# Patient Record
Sex: Female | Born: 1970 | Race: White | Hispanic: No | Smoking: Current every day smoker
Health system: Southern US, Community
[De-identification: ages and names within clinical notes are randomized; demographics above are authoritative.]

## PROBLEM LIST (undated history)

## (undated) DIAGNOSIS — I10 Essential (primary) hypertension: Secondary | ICD-10-CM

## (undated) DIAGNOSIS — F419 Anxiety disorder, unspecified: Secondary | ICD-10-CM

## (undated) HISTORY — DX: Essential (primary) hypertension: I10

## (undated) HISTORY — DX: Anxiety disorder, unspecified: F41.9

## (undated) HISTORY — PX: TUBAL LIGATION: SHX77

---

## 2000-10-23 ENCOUNTER — Inpatient Hospital Stay (HOSPITAL_COMMUNITY): Admission: AD | Admit: 2000-10-23 | Discharge: 2000-10-23 | Payer: Self-pay | Admitting: Obstetrics

## 2000-12-01 ENCOUNTER — Inpatient Hospital Stay (HOSPITAL_COMMUNITY): Admission: AD | Admit: 2000-12-01 | Discharge: 2000-12-01 | Payer: Self-pay | Admitting: Obstetrics

## 2000-12-13 ENCOUNTER — Inpatient Hospital Stay (HOSPITAL_COMMUNITY): Admission: AD | Admit: 2000-12-13 | Discharge: 2000-12-13 | Payer: Self-pay | Admitting: Obstetrics

## 2000-12-18 ENCOUNTER — Inpatient Hospital Stay (HOSPITAL_COMMUNITY): Admission: AD | Admit: 2000-12-18 | Discharge: 2000-12-21 | Payer: Self-pay | Admitting: Obstetrics

## 2001-08-11 ENCOUNTER — Encounter: Payer: Self-pay | Admitting: Emergency Medicine

## 2001-08-11 ENCOUNTER — Emergency Department (HOSPITAL_COMMUNITY): Admission: EM | Admit: 2001-08-11 | Discharge: 2001-08-11 | Payer: Self-pay | Admitting: Emergency Medicine

## 2001-11-16 ENCOUNTER — Inpatient Hospital Stay (HOSPITAL_COMMUNITY): Admission: AD | Admit: 2001-11-16 | Discharge: 2001-11-16 | Payer: Self-pay | Admitting: Obstetrics and Gynecology

## 2002-04-17 ENCOUNTER — Inpatient Hospital Stay (HOSPITAL_COMMUNITY): Admission: AD | Admit: 2002-04-17 | Discharge: 2002-04-17 | Payer: Self-pay | Admitting: Obstetrics

## 2002-04-18 ENCOUNTER — Inpatient Hospital Stay (HOSPITAL_COMMUNITY): Admission: AD | Admit: 2002-04-18 | Discharge: 2002-04-20 | Payer: Self-pay | Admitting: Obstetrics

## 2002-04-19 ENCOUNTER — Encounter (INDEPENDENT_AMBULATORY_CARE_PROVIDER_SITE_OTHER): Payer: Self-pay | Admitting: *Deleted

## 2002-04-28 ENCOUNTER — Inpatient Hospital Stay (HOSPITAL_COMMUNITY): Admission: AD | Admit: 2002-04-28 | Discharge: 2002-05-01 | Payer: Self-pay | Admitting: Obstetrics

## 2003-08-19 ENCOUNTER — Emergency Department (HOSPITAL_COMMUNITY): Admission: EM | Admit: 2003-08-19 | Discharge: 2003-08-19 | Payer: Self-pay | Admitting: Emergency Medicine

## 2004-10-10 ENCOUNTER — Emergency Department (HOSPITAL_COMMUNITY): Admission: EM | Admit: 2004-10-10 | Discharge: 2004-10-10 | Payer: Self-pay | Admitting: Emergency Medicine

## 2005-04-14 ENCOUNTER — Emergency Department (HOSPITAL_COMMUNITY): Admission: EM | Admit: 2005-04-14 | Discharge: 2005-04-14 | Payer: Self-pay | Admitting: Emergency Medicine

## 2008-07-04 ENCOUNTER — Emergency Department (HOSPITAL_COMMUNITY): Admission: EM | Admit: 2008-07-04 | Discharge: 2008-07-04 | Payer: Self-pay | Admitting: Emergency Medicine

## 2010-01-11 ENCOUNTER — Emergency Department (HOSPITAL_COMMUNITY): Admission: EM | Admit: 2010-01-11 | Discharge: 2010-01-11 | Payer: Self-pay | Admitting: Emergency Medicine

## 2010-07-01 LAB — URINE MICROSCOPIC-ADD ON

## 2010-07-01 LAB — URINE CULTURE: Culture  Setup Time: 201109270208

## 2010-07-01 LAB — URINALYSIS, ROUTINE W REFLEX MICROSCOPIC
Glucose, UA: NEGATIVE mg/dL
Nitrite: POSITIVE — AB
Protein, ur: >300 mg/dL — AB
Specific Gravity, Urine: >1.03 — ABNORMAL HIGH (ref 1.005–1.030)
Urobilinogen, UA: 1 mg/dL (ref 0.0–1.0)
pH: 6.5 (ref 5.0–8.0)

## 2010-09-03 NOTE — Op Note (Signed)
   NAME:  TERRIN, Katherine Bates                       ACCOUNT NO.:  [id number]   MEDICAL RECORD NO.:  [id number]                   PATIENT TYPE:  INP   LOCATION:  9143                                 FACILITY:  [facility]   PHYSICIAN:  Kathreen Cosier, M.D.           DATE OF BIRTH:  [birth year]   DATE OF PROCEDURE:  DATE OF DISCHARGE:                                 OPERATIVE REPORT   PREOPERATIVE DIAGNOSES:  Multiparity.   PROCEDURE:  Postpartum tubal ligation.  Using epidural patient in supine  position, abdomen prepped and draped.  Bladder emptied with a straight  catheter.  Midline subumbilical incision 1 inch long was made, carried down  to the fascia.  Fascia grasped with two Kochers and fascia and peritoneum  opened with Mayo scissors.  The left tube was grasped in the mid portion  with Babcock clamp and entered, traced to the fimbria.  0 plain suture  placed in the mesosalpinx below the portion of tube within the clamp.  This  was tied and approximately 1 inch of tube transected.  Procedure done in the  exact fashion the other side.  Lap and sponge count was correct.  Abdomen  closed in layers.  Peritoneum and fascia continuous suture of 0 Dexon.  Skin  closed with subcuticular stitch of 0 plain.  The patient tolerated procedure  well.  Taken to recovery room in good condition.                                               Kathreen Cosier, M.D.    BAM/MEDQ  D:  04/19/2002  T:  04/19/2002  Job:  [id number]

## 2010-09-03 NOTE — Discharge Summary (Signed)
   NAME:  DELA, SWEENY NO.:  [id number]   MEDICAL RECORD NO.:  [id number]                   PATIENT TYPE:  INP   LOCATION:  9313                                 FACILITY:  [facility]   PHYSICIAN:  Kathreen Cosier, M.D.           DATE OF BIRTH:  12-Oct-1970   DATE OF ADMISSION:  04/28/2002  DATE OF DISCHARGE:  05/01/2002                                 DISCHARGE SUMMARY   HOSPITAL COURSE:  The patient is a 40 year old gravida 6, para 5 with back  pain, fever and a positive urine.  Her temperature was 102 on admission with  a diagnosis of pyelonephritis.  She was treated with Rocephin 2 g IV every  24 hours.  She rapidly defervesced and by May 01, 2002, she felt much  better and she was afebrile within 24 hours.   LABORATORY DATA:  Her white count was 9.9, hemoglobin 12.9.  Sodium 142,  potassium 3.5, chloride 107, CO2 24.   DISPOSITION:  She was discharged home on May 01, 2002, on ampicillin 500  mg p.o. q.6h. for five days.   DISCHARGE DIAGNOSIS:  Status post pyelonephritis.                                               Kathreen Cosier, M.D.    BAM/MEDQ  D:  06/10/2002  T:  06/10/2002  Job:  [id number]

## 2010-09-03 NOTE — Discharge Summary (Signed)
   NAME:  Katherine Bates, Katherine Bates                       ACCOUNT NO.:  [id number]   MEDICAL RECORD NO.:  [id number]                   PATIENT TYPE:  INP   LOCATION:  9143                                 FACILITY:  [facility]   PHYSICIAN:  Kathreen Cosier, M.D.           DATE OF BIRTH:  Aug 25, 1970   DATE OF ADMISSION:  04/18/2002  DATE OF DISCHARGE:  04/20/2002                                 DISCHARGE SUMMARY   HOSPITAL COURSE:  The patient is a 40 year old gravida 6 para 4-0-1-4, EDC  April 21, 2002.  She was admitted in labor.  She had a negative GBS.  She  was 4 cm, 70%, vertex, -1.  Membranes were artificially ruptured and the  fluid was clear.  On admission her white count was 10.4, hemoglobin 10,  platelets 169.  Urinalysis was negative.  RPR was negative.  She had a  normal vaginal delivery, Apgar 9 and 9, female, and she desired  sterilization.  On April 19, 2002 she underwent postpartum tubal ligation  and did well.  She was discharged home on postpartum day #2, ambulatory, on  a regular diet.   MEDICATIONS:  1. Tylox one to two q.3-4h for pain.  2. Pyridium 200 mg p.o. t.i.d. p.r.n.   DISCHARGE DIAGNOSES:  1. Status post normal vaginal delivery at term.  2. Postpartum tubal ligation.                                               Kathreen Cosier, M.D.    BAM/MEDQ  D:  04/20/2002  T:  04/20/2002  Job:  [id number]

## 2012-02-28 ENCOUNTER — Emergency Department (HOSPITAL_COMMUNITY): Payer: MEDICAID

## 2012-02-28 ENCOUNTER — Encounter (HOSPITAL_COMMUNITY): Payer: Self-pay | Admitting: Emergency Medicine

## 2012-02-28 ENCOUNTER — Emergency Department (HOSPITAL_COMMUNITY)
Admission: EM | Admit: 2012-02-28 | Discharge: 2012-02-29 | Disposition: A | Discharge status: Home / Self Care | Payer: MEDICAID | Attending: Emergency Medicine | Admitting: Emergency Medicine

## 2012-02-28 DIAGNOSIS — F410 Panic disorder [episodic paroxysmal anxiety] without agoraphobia: Secondary | ICD-10-CM | POA: Insufficient documentation

## 2012-02-28 DIAGNOSIS — F1410 Cocaine abuse, uncomplicated: Secondary | ICD-10-CM | POA: Insufficient documentation

## 2012-02-28 DIAGNOSIS — F17200 Nicotine dependence, unspecified, uncomplicated: Secondary | ICD-10-CM | POA: Insufficient documentation

## 2012-02-28 LAB — POCT I-STAT TROPONIN I: Troponin i, poc: 0 ng/mL (ref 0.00–0.08)

## 2012-02-28 NOTE — ED Notes (Signed)
Pt c/o shortness of breath and anxiety/depression.  States she is going through a "bad break-up".  States she has been having panic attacks everyday for the past few weeks.  Denies any alcohol use tonight but states she used cocaine earlier.

## 2012-02-28 NOTE — ED Provider Notes (Signed)
History     CSN: [id number]  Arrival date & time 02/28/12  2241   First MD Initiated Contact with Patient 02/28/12 2303      Chief Complaint  Patient presents with  . Panic Attack    (Consider location/radiation/quality/duration/timing/severity/associated sxs/prior treatment) HPI Comments: Patient is a 41 year old female who presents to the emergency department with complaint of" panic attack". The patient states that she has been very" depressed" because of a bad breakup. Approximately a week ago she tried some alcohol, and earlier this evening she tried some cocaine. She is unsure of exactly how much was as it was a very small amount. The patient states she has never used cocaine before and she is unfamiliar with how it makes her feel. The patient states surely after the cocaine she began to feel" funny in her chest, began to hyperventilate, and then panic. She attempted to relax in the shower but states this did not help and she then presented to the emergency department for evaluation and assistance with this problem.she denies suicidal or homicidal ideation.  The history is provided by the patient.    History reviewed. No pertinent past medical history.  History reviewed. No pertinent past surgical history.  No family history on file.  History  Substance Use Topics  . Smoking status: Current Every Day Smoker    Types: Cigarettes  . Smokeless tobacco: Not on file  . Alcohol Use: Yes    OB History    Grav Para Term Preterm Abortions TAB SAB Ect Mult Living                  Review of Systems  Constitutional: Negative for activity change.       All ROS Neg except as noted in HPI  HENT: Negative for nosebleeds and neck pain.   Eyes: Negative for photophobia and discharge.  Respiratory: Positive for shortness of breath. Negative for cough and wheezing.   Cardiovascular: Positive for palpitations. Negative for chest pain.  Gastrointestinal: Negative for abdominal pain  and blood in stool.  Genitourinary: Negative for dysuria, frequency and hematuria.  Musculoskeletal: Negative for back pain and arthralgias.  Skin: Negative.   Neurological: Negative for dizziness, seizures and speech difficulty.  Psychiatric/Behavioral: Negative for suicidal ideas, hallucinations and confusion. The patient is nervous/anxious.     Allergies  Review of patient's allergies indicates no known allergies.  Home Medications  No current outpatient prescriptions on file.  BP 119/75  Pulse 125  Temp 97.5 F (36.4 C) (Oral)  Resp 18  Ht 5\' 5"  (1.651 m)  Wt 179 lb (81.194 kg)  BMI 29.79 kg/m2  SpO2 100%  LMP 02/26/2012  Physical Exam  Nursing note and vitals reviewed. Constitutional: She is oriented to person, place, and time. She appears well-developed and well-nourished.  Non-toxic appearance.  HENT:  Head: Normocephalic.  Right Ear: Tympanic membrane and external ear normal.  Left Ear: Tympanic membrane and external ear normal.  Eyes: EOM and lids are normal. Pupils are equal, round, and reactive to light.  Neck: Normal range of motion. Neck supple. Carotid bruit is not present.  Cardiovascular: Regular rhythm, normal heart sounds, intact distal pulses and normal pulses.  Tachycardia present.   Pulmonary/Chest: Breath sounds normal. No respiratory distress.  Abdominal: Soft. Bowel sounds are normal. There is no tenderness. There is no guarding.  Musculoskeletal: Normal range of motion.  Lymphadenopathy:       Head (right side): No submandibular adenopathy present.  Head (left side): No submandibular adenopathy present.    She has no cervical adenopathy.  Neurological: She is alert and oriented to person, place, and time. She has normal strength. No cranial nerve deficit or sensory deficit.  Skin: Skin is warm and dry.  Psychiatric: Her speech is normal. Her mood appears anxious. She expresses no homicidal and no suicidal ideation. She expresses no suicidal  plans and no homicidal plans.       Pt anxious and panicking after using cocaine tonight. She used the cocaine because of problems with depression relating to a "break up".    ED Course  Procedures (including critical care time)  Labs Reviewed - No data to display No results found. ZOX:WRUEA tachycardia.Rate 112. Axis normal. PR wnl. QRS wnl. ST wnl. No STEMI. No live threatening arrhythmia. No old EKG for comparison.  No diagnosis found. Pulse Ox 100% on Room Air. WNL by my interpretation.   MDM  I have reviewed nursing notes, vital signs, and all appropriate lab and imaging results for this patient. Chest xray is negative for acute problem. EKG reveals sinus tachycardia, but no acute changes. Troponin in negative. Test results given to the patient. Tachycardia imiproving but not resolved. Ativan po given for "nervousness". Will observe for now. Pt talking on the phone without problem. Pt states she will not try this again because she has children she is concerned about. She request to speak to a counselor as an out patient. [facility]  Phone and address will be given to the patient. 0107 - Pt resting in bed. States she feels much better. Heart rate 90. Safe for pt to be d/c home.      Kathie Dike, [place] 02/29/12 [phone]

## 2012-02-29 MED ORDER — PROMETHAZINE HCL 25 MG/ML IJ SOLN: 12.5000 mg | Freq: Once | INTRAMUSCULAR | Status: DC

## 2012-02-29 MED ORDER — LORAZEPAM 0.5 MG PO TABS: ORAL_TABLET | ORAL | Status: DC

## 2012-02-29 MED ORDER — LORAZEPAM 1 MG PO TABS: 0.5000 mg | ORAL_TABLET | Freq: Three times a day (TID) | ORAL | Status: DC | PRN

## 2012-02-29 MED ORDER — HYDROMORPHONE HCL PF 1 MG/ML IJ SOLN: 0.5000 mg | Freq: Once | INTRAMUSCULAR | Status: DC

## 2012-02-29 MED ORDER — LORAZEPAM 1 MG PO TABS
2.0000 mg | ORAL_TABLET | Freq: Once | ORAL | Status: AC
  Administered 2012-02-29: 2 mg via ORAL
  Filled 2012-02-29: qty 2

## 2012-02-29 NOTE — ED Provider Notes (Signed)
Medical screening examination/treatment/procedure(s) were performed by non-physician practitioner and as supervising physician I was immediately available for consultation/collaboration.  Nicoletta Dress. Colon Branch, MD 02/29/12 [phone]

## 2012-03-24 ENCOUNTER — Emergency Department (HOSPITAL_COMMUNITY)
Admission: EM | Admit: 2012-03-24 | Discharge: 2012-03-24 | Disposition: A | Discharge status: Home / Self Care | Payer: Medicaid Other | Attending: Emergency Medicine | Admitting: Emergency Medicine

## 2012-03-24 ENCOUNTER — Emergency Department (HOSPITAL_COMMUNITY): Payer: Medicaid Other

## 2012-03-24 ENCOUNTER — Encounter (HOSPITAL_COMMUNITY): Payer: Self-pay | Admitting: Emergency Medicine

## 2012-03-24 DIAGNOSIS — M25469 Effusion, unspecified knee: Secondary | ICD-10-CM | POA: Insufficient documentation

## 2012-03-24 DIAGNOSIS — M25461 Effusion, right knee: Secondary | ICD-10-CM

## 2012-03-24 DIAGNOSIS — Y92410 Unspecified street and highway as the place of occurrence of the external cause: Secondary | ICD-10-CM | POA: Insufficient documentation

## 2012-03-24 DIAGNOSIS — Y9389 Activity, other specified: Secondary | ICD-10-CM | POA: Insufficient documentation

## 2012-03-24 DIAGNOSIS — Z79899 Other long term (current) drug therapy: Secondary | ICD-10-CM | POA: Insufficient documentation

## 2012-03-24 DIAGNOSIS — F17200 Nicotine dependence, unspecified, uncomplicated: Secondary | ICD-10-CM | POA: Insufficient documentation

## 2012-03-24 MED ORDER — IBUPROFEN 800 MG PO TABS
800.0000 mg | ORAL_TABLET | Freq: Once | ORAL | Status: AC
  Administered 2012-03-24: 800 mg via ORAL
  Filled 2012-03-24: qty 1

## 2012-03-24 MED ORDER — HYDROCODONE-ACETAMINOPHEN 5-325 MG PO TABS
1.0000 | ORAL_TABLET | Freq: Once | ORAL | Status: AC
  Administered 2012-03-24: 1 via ORAL
  Filled 2012-03-24: qty 1

## 2012-03-24 MED ORDER — HYDROCODONE-ACETAMINOPHEN 5-325 MG PO TABS: 1.0000 | ORAL_TABLET | Freq: Four times a day (QID) | ORAL | Status: AC | PRN
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
## 2012-03-24 NOTE — ED Notes (Signed)
Pt was provided with [employer] Information for pt to call if she wishes to, states that she has a safe place to return at d/c

## 2012-03-24 NOTE — ED Provider Notes (Signed)
Medical screening examination/treatment/procedure(s) were performed by non-physician practitioner and as supervising physician I was immediately available for consultation/collaboration.   Shelby Anderle B. Bernette Mayers, MD 03/24/12 1234
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
## 2012-03-24 NOTE — ED Notes (Signed)
R. Miller, PA at bedside  

## 2012-04-04 ENCOUNTER — Other Ambulatory Visit (HOSPITAL_COMMUNITY): Payer: Self-pay | Admitting: Orthopaedic Surgery

## 2012-04-04 DIAGNOSIS — M25561 Pain in right knee: Secondary | ICD-10-CM

## 2012-04-04 DIAGNOSIS — T1490XA Injury, unspecified, initial encounter: Secondary | ICD-10-CM

## 2012-04-06 ENCOUNTER — Ambulatory Visit (HOSPITAL_COMMUNITY): Admission: RE | Admit: 2012-04-06 | Payer: Medicaid Other | Source: Ambulatory Visit

## 2014-08-15 ENCOUNTER — Telehealth: Payer: Self-pay | Admitting: *Deleted

## 2014-08-15 NOTE — Telephone Encounter (Signed)
Pt states that she shaves "down there". Pt states that there is a place on the left part of her lip, at the bottom. Pt states that she has had the place for about a month. Pt states that it's like a little ball on the inside of her lip. Pt denies any redness or soreness. Pt was advised that she would need to be seen in order for us to tell her exactly what it is. Pt verbalized understanding and phone call was switched up front for an appointment for next week.

## 2014-08-19 ENCOUNTER — Ambulatory Visit: Payer: Self-pay | Admitting: Women's Health

## 2014-08-21 ENCOUNTER — Ambulatory Visit (INDEPENDENT_AMBULATORY_CARE_PROVIDER_SITE_OTHER): Payer: Medicaid Other | Admitting: Advanced Practice Midwife

## 2014-08-21 ENCOUNTER — Encounter: Payer: Self-pay | Admitting: Advanced Practice Midwife

## 2014-08-21 VITALS — BP 160/102 | HR 80 | Ht 65.0 in | Wt 183.0 lb

## 2014-08-21 DIAGNOSIS — N758 Other diseases of Bartholin's gland: Secondary | ICD-10-CM

## 2014-08-21 DIAGNOSIS — Z1389 Encounter for screening for other disorder: Secondary | ICD-10-CM | POA: Diagnosis not present

## 2014-08-21 DIAGNOSIS — Z113 Encounter for screening for infections with a predominantly sexual mode of transmission: Secondary | ICD-10-CM | POA: Diagnosis not present

## 2014-08-21 DIAGNOSIS — R319 Hematuria, unspecified: Secondary | ICD-10-CM

## 2014-08-21 LAB — POCT URINALYSIS DIPSTICK
GLUCOSE UA: NEGATIVE
KETONES UA: NEGATIVE
Leukocytes, UA: NEGATIVE
NITRITE UA: NEGATIVE

## 2014-08-21 NOTE — Patient Instructions (Signed)
Call [phone] to schedule mammogram.

## 2014-08-21 NOTE — Progress Notes (Signed)
[facility] Visit  Patient name: Katherine Bates MRN [id number]  Date of birth: Jan 26, 1971  CC & HPI:  Katherine Bates is a 44 y.o. Caucasian female presenting today for C/O "boil" on left labia.  She states she was shaving about a month ago and niticed it.  It does not hurt, it has not gotten any bigger since she noticed it. She has not done anything to help it.  Sometimes it bothers her "because it rubs on my underware" Last pa 5 years ago.  Never has had a mammogram  Pertinent History Reviewed:  Medical & Surgical Hx:   Past Medical History  Diagnosis Date  . Hypertension   . Anxiety    Past Surgical History  Procedure Laterality Date  . Tubal ligation     Family History  Problem Relation Age of Onset  . Hyperlipidemia Mother   . Osteoporosis Mother   . Cancer Mother   . Diabetes Father   . Heart disease Father   . Alcohol abuse Brother   . Hypertension Maternal Grandmother   . Hypertension Maternal Grandfather     Current outpatient prescriptions:  .  clonazePAM (KLONOPIN) 0.5 MG tablet, Take 0.5 mg by mouth every 8 (eight) hours as needed for anxiety., Disp: , Rfl:  .  hydrochlorothiazide (MICROZIDE) 12.5 MG capsule, Take 12.5 mg by mouth daily., Disp: , Rfl:  .  LORazepam (ATIVAN) 1 MG tablet, Take 0.5 tablets (0.5 mg total) by mouth 3 (three) times daily as needed for anxiety. (Patient not taking: Reported on 08/21/2014), Disp: 15 tablet, Rfl: 0 Social History: Reviewed -  reports that she has been smoking Cigarettes.  She has never used smokeless tobacco.  Review of Systems:   Constitutional: Negative for fever and chills Eyes: Negative for visual disturbances Respiratory: Negative for shortness of breath, dyspnea Cardiovascular: Negative for chest pain or palpitations  Gastrointestinal: Negative for vomiting, diarrhea and constipation; no abdominal pain Genitourinary: Negative for dysuria and urgency, vaginal irritation or itching Musculoskeletal: Negative for  back pain, joint pain, myalgias  Neurological: Negative for dizziness and headaches    Objective Findings:  Vitals: BP 160/102 mmHg  Pulse 80  Ht 5\' 5"  (1.651 m)  Wt 183 lb (83.008 kg)  BMI 30.45 kg/m2  LMP 08/17/2014 (Approximate)  Physical Examination: General appearance - alert, well appearing, and in no distress Mental status - alert, oriented to person, place, and time Pelvic - Vulva normal  Vagina:  Looks normal.  When shown by pt where the "boil" is, there is noted to be a soft, mobile 2cm thickened area in the area of the bartholin gland. It is non tender, non erythemous, non inflammed. Discharge is normal without odor. Co-Exam with Cyril MourningJennifer Griffin, NP.     Results for orders placed or performed in visit on 08/21/14 (from the past 24 hour(s))  POCT urinalysis dipstick   Collection Time: 08/21/14  2:10 PM  Result Value Ref Range   Color, UA yellow    Clarity, UA dark    Glucose, UA neg    Bilirubin, UA     Ketones, UA neg    Spec Grav, UA     Blood, UA large    pH, UA     Protein, UA trace    Urobilinogen, UA     Nitrite, UA neg    Leukocytes, UA Negative        Assessment & Plan:  A:   ? Bartholin gland cyst, ? Fatty tissie  P:  Pt to schedule pap and physical for 3 weeks with either me or Victorino DikeJennifer so we can compare it.  Wonder whether anything needs to be done at all.  Mammogram recommended  CRESENZO-DISHMAN,Sreeja Spies CNM 08/21/2014 3:39 PM

## 2014-08-21 NOTE — Addendum Note (Signed)
Addended by: Criss AlvinePULLIAM, Keldan Eplin G on: 08/21/2014 04:49 PM   Modules accepted: Orders

## 2014-08-23 LAB — GC/CHLAMYDIA PROBE AMP
Chlamydia trachomatis, NAA: POSITIVE — AB
NEISSERIA GONORRHOEAE BY PCR: NEGATIVE

## 2014-08-27 ENCOUNTER — Other Ambulatory Visit: Payer: Self-pay | Admitting: Advanced Practice Midwife

## 2014-08-27 DIAGNOSIS — A749 Chlamydial infection, unspecified: Secondary | ICD-10-CM

## 2014-08-27 MED ORDER — AZITHROMYCIN 500 MG PO TABS: 1000.0000 mg | ORAL_TABLET | Freq: Every day | ORAL | Status: DC
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
## 2014-09-11 ENCOUNTER — Other Ambulatory Visit: Payer: Medicaid Other | Admitting: Advanced Practice Midwife

## 2014-09-22 IMAGING — CR DG KNEE COMPLETE 4+V*R*
4 series · 4 of 4 positions shown · non-contrast
Comparison: None.

CLINICAL DATA: Knee pain.

RIGHT KNEE - COMPLETE 4+ VIEW

[view not recorded (1 of 4)]
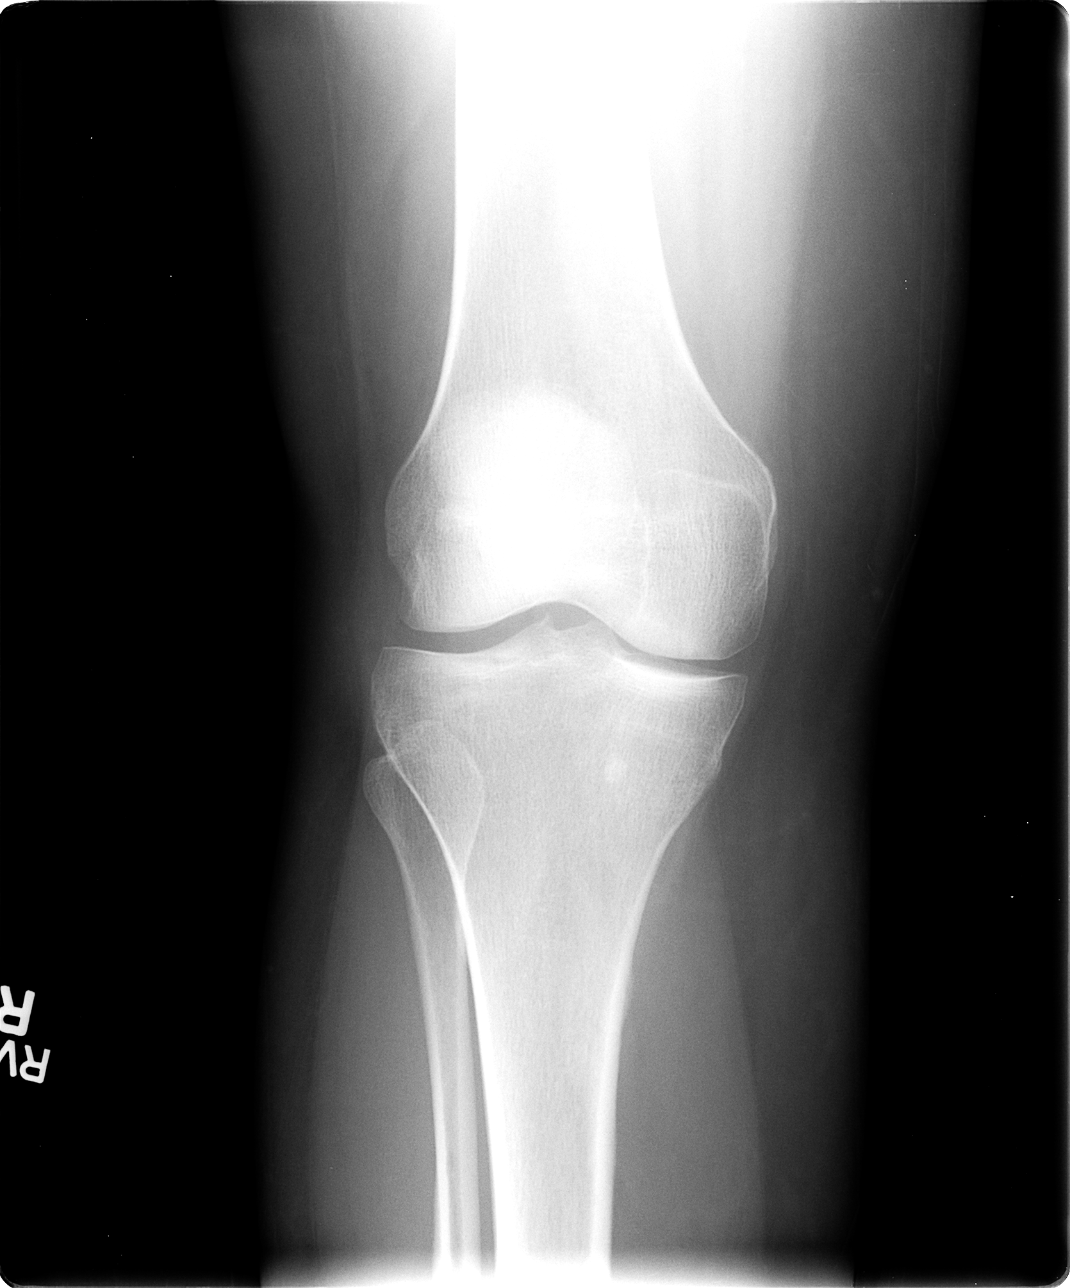

[view not recorded (2 of 4)]
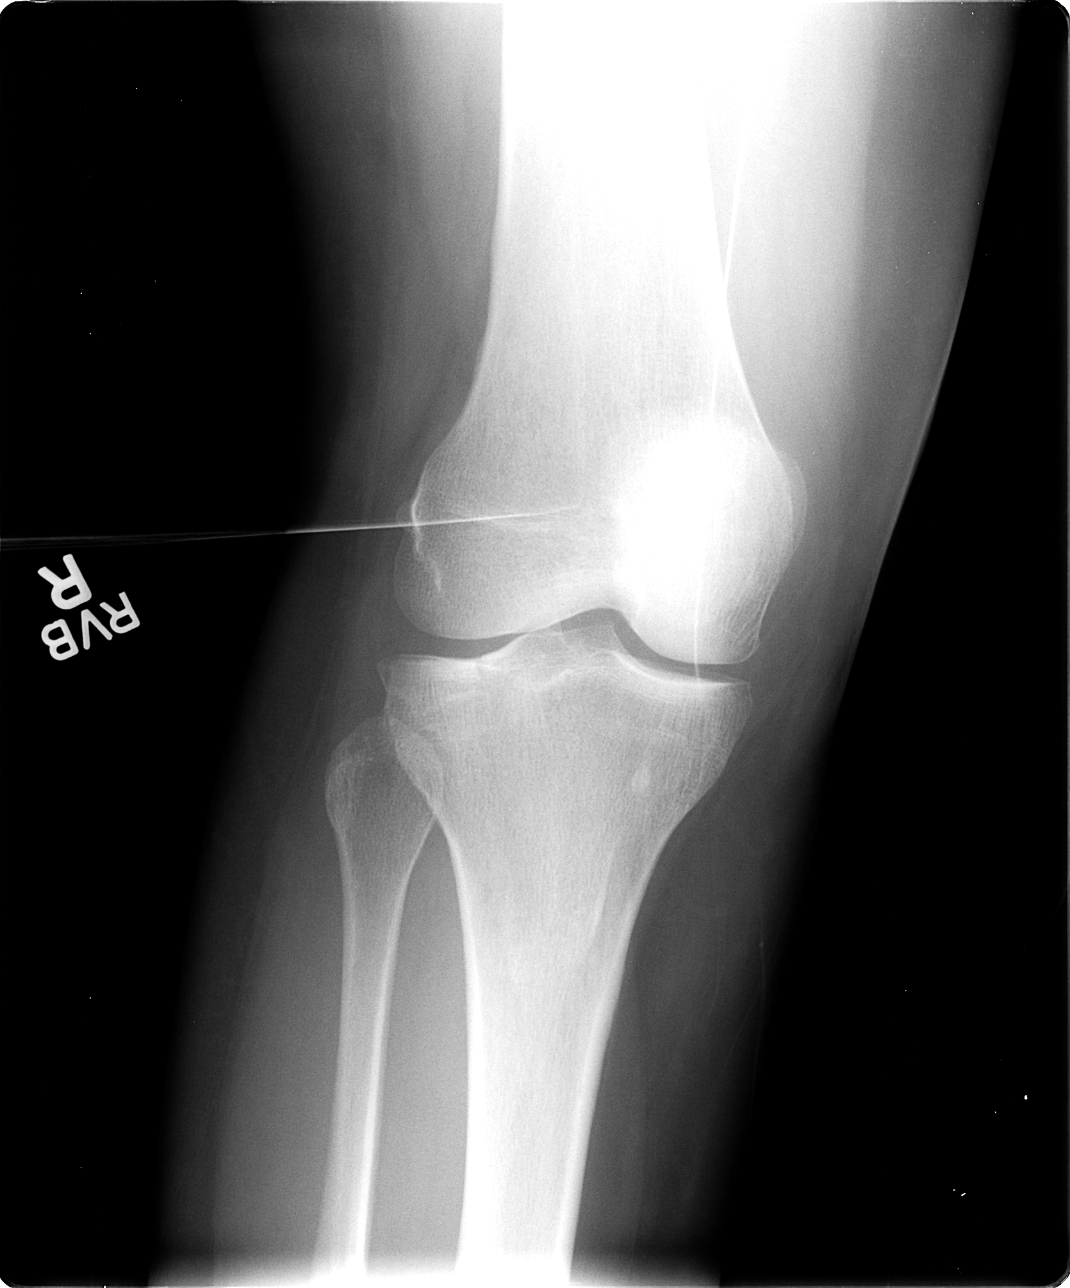

[view not recorded (3 of 4)]
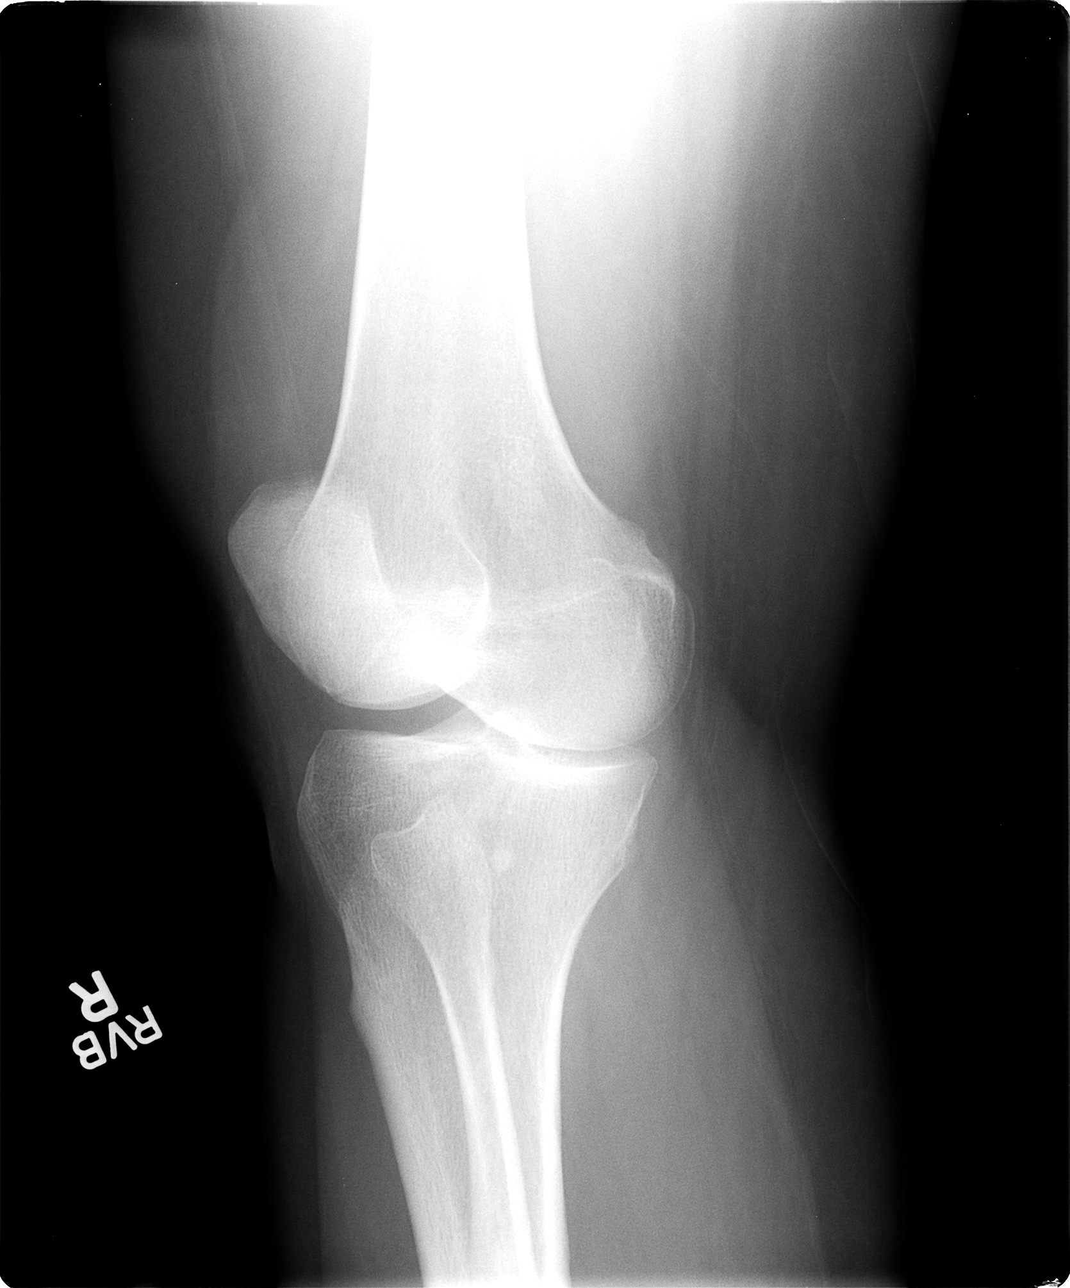

[view not recorded (4 of 4)]
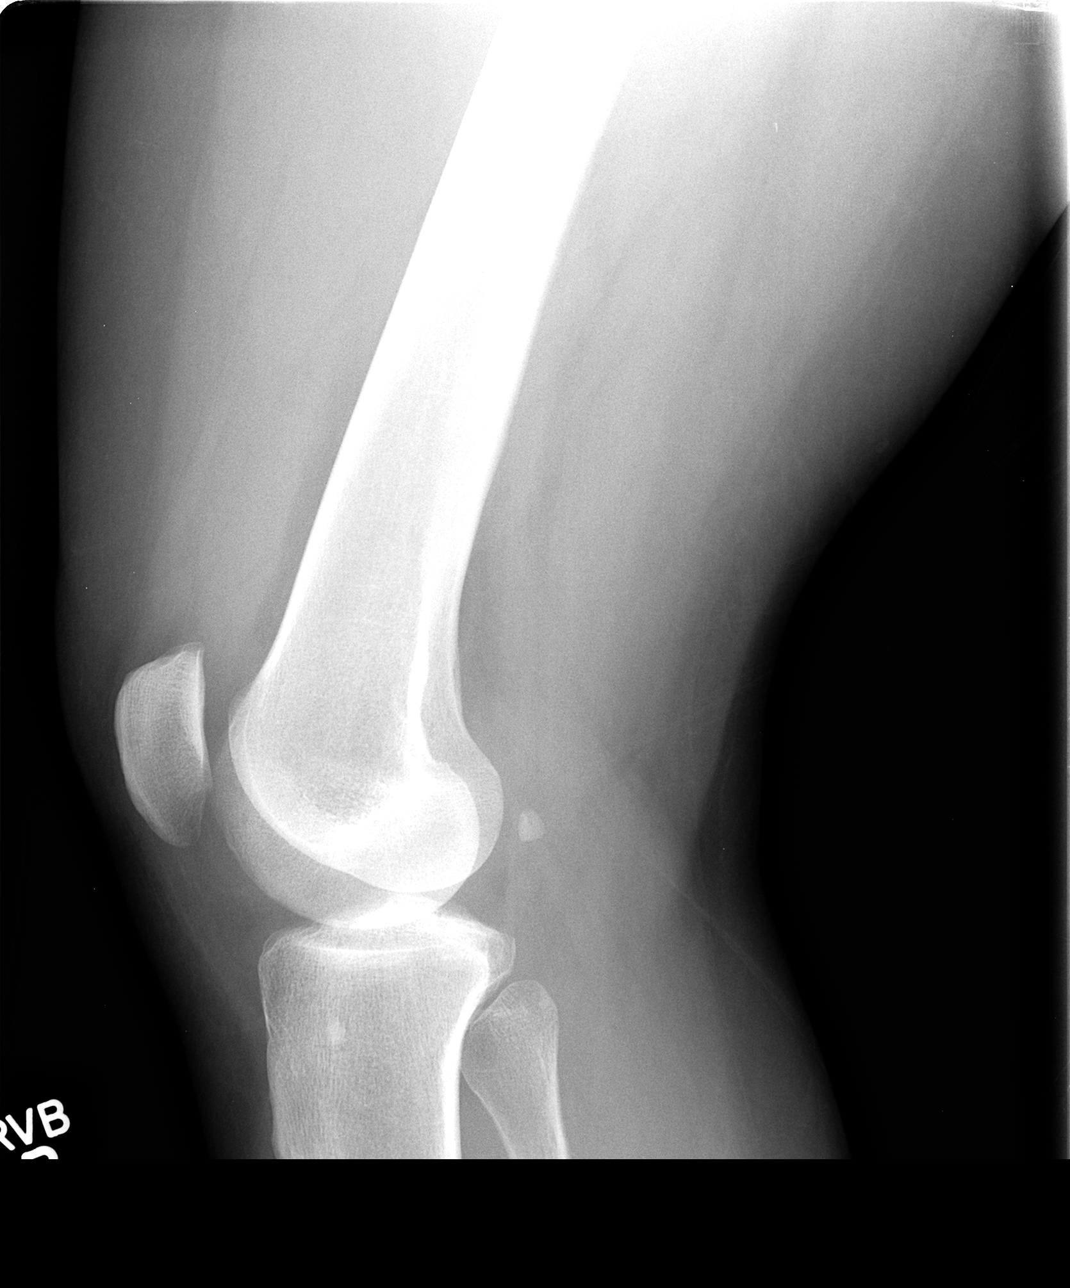

[4 of 4 positions shown; findings below may reference images not displayed]

FINDINGS: Small joint effusion is noted.  There is no fracture or
dislocation.  Joint spaces are preserved.
IMPRESSION: Small joint effusion.  Otherwise negative.

## 2015-09-28 ENCOUNTER — Emergency Department (HOSPITAL_COMMUNITY)
Admission: EM | Admit: 2015-09-28 | Discharge: 2015-09-28 | Disposition: A | Discharge status: Home / Self Care | Payer: Medicaid Other | Attending: Emergency Medicine | Admitting: Emergency Medicine

## 2015-09-28 ENCOUNTER — Encounter (HOSPITAL_COMMUNITY): Payer: Self-pay | Admitting: Emergency Medicine

## 2015-09-28 DIAGNOSIS — K047 Periapical abscess without sinus: Secondary | ICD-10-CM | POA: Insufficient documentation

## 2015-09-28 DIAGNOSIS — F17210 Nicotine dependence, cigarettes, uncomplicated: Secondary | ICD-10-CM | POA: Insufficient documentation

## 2015-09-28 DIAGNOSIS — Z791 Long term (current) use of non-steroidal anti-inflammatories (NSAID): Secondary | ICD-10-CM | POA: Diagnosis not present

## 2015-09-28 DIAGNOSIS — I10 Essential (primary) hypertension: Secondary | ICD-10-CM | POA: Insufficient documentation

## 2015-09-28 DIAGNOSIS — K0889 Other specified disorders of teeth and supporting structures: Secondary | ICD-10-CM | POA: Diagnosis present

## 2015-09-28 MED ORDER — AMOXICILLIN 500 MG PO CAPS: 500.0000 mg | ORAL_CAPSULE | Freq: Three times a day (TID) | ORAL | Status: AC

## 2015-09-28 MED ORDER — IBUPROFEN 600 MG PO TABS: 600.0000 mg | ORAL_TABLET | Freq: Four times a day (QID) | ORAL | Status: AC | PRN

## 2015-09-28 MED ORDER — TRAMADOL HCL 50 MG PO TABS: 50.0000 mg | ORAL_TABLET | Freq: Four times a day (QID) | ORAL | Status: AC | PRN

## 2015-09-28 NOTE — ED Notes (Signed)
At discharge pt states she is allergic to tramadol.  Allergy added to pt's allergy list and rx placed in shred box.

## 2015-09-28 NOTE — ED Notes (Signed)
Pt states she has a painful, swollen tooth on bottom right.  Also c/o sinus congestion.

## 2015-09-28 NOTE — Discharge Instructions (Signed)
Take Zyrtec for congestion daily. Take ibuprofen for pain. Take tramadol for severe pain. Take amoxicillin as prescribed until all gone for both sinus infection and dental infection. Follow-up with a dentist as soon as able.   Dental Abscess A dental abscess is a collection of pus in or around a tooth. CAUSES This condition is caused by a bacterial infection around the root of the tooth that involves the inner part of the tooth (pulp). It may result from:  Severe tooth decay.  Trauma to the tooth that allows bacteria to enter into the pulp, such as a broken or chipped tooth.  Severe gum disease around a tooth. SYMPTOMS Symptoms of this condition include:  Severe pain in and around the infected tooth.  Swelling and redness around the infected tooth, in the mouth, or in the face.  Tenderness.  Pus drainage.  Bad breath.  Bitter taste in the mouth.  Difficulty swallowing.  Difficulty opening the mouth.  Nausea.  Vomiting.  Chills.  Swollen neck glands.  Fever. DIAGNOSIS This condition is diagnosed with examination of the infected tooth. During the exam, your dentist may tap on the infected tooth. Your dentist will also ask about your medical and dental history and may order X-rays. TREATMENT This condition is treated by eliminating the infection. This may be done with:  Antibiotic medicine.  A root canal. This may be performed to save the tooth.  Pulling (extracting) the tooth. This may also involve draining the abscess. This is done if the tooth cannot be saved. HOME CARE INSTRUCTIONS  Take medicines only as directed by your dentist.  If you were prescribed antibiotic medicine, finish all of it even if you start to feel better.  Rinse your mouth (gargle) often with salt water to relieve pain or swelling.  Do not drive or operate heavy machinery while taking pain medicine.  Do not apply heat to the outside of your mouth.  Keep all follow-up visits as  directed by your dentist. This is important. SEEK MEDICAL CARE IF:  Your pain is worse and is not helped by medicine. SEEK IMMEDIATE MEDICAL CARE IF:  You have a fever or chills.  Your symptoms suddenly get worse.  You have a very bad headache.  You have problems breathing or swallowing.  You have trouble opening your mouth.  You have swelling in your neck or around your eye.   This information is not intended to replace advice given to you by your health care provider. Make sure you discuss any questions you have with your health care provider.   Document Released: 04/04/2005 Document Revised: 08/19/2014 Document Reviewed: 04/01/2014 Elsevier Interactive Patient Education [employer]2016 [employer]

## 2015-09-28 NOTE — ED Provider Notes (Signed)
CSN: [id number]     Arrival date & time 09/28/15  1247 History  By signing my name below, I, Katherine Bates, attest that this documentation has been prepared under the direction and in the presence of Riki Gehring A Isaiahs Chancy, PA-C. Electronically Signed: Ronney LionSuzanne Bates, ED Scribe. 09/28/2015. 2:13 PM.    Chief Complaint  Patient presents with  . Dental Pain    The history is provided by the patient. No language interpreter was used.   HPI Comments: Katherine Bates is a 45 y.o. female with a history of hypertension and anxiety, who presents to the Emergency Department complaining of constant, severe, aching lower right dental pain that began when she [place] from the beach 1 [place] ago. She also complains of sinus congestion, bilateral ear fullness, and watery eye discharge. Patient reports a history of sinus infections, but states she is unsure whether her symptoms are connected to her dental pain. No treatments were noted. Patient states exposure to cold air seemed to exacerbate her dental pain.  Past Medical History  Diagnosis Date  . Hypertension   . Anxiety    Past Surgical History  Procedure Laterality Date  . Tubal ligation     Family History  Problem Relation Age of Onset  . Hyperlipidemia Mother   . Osteoporosis Mother   . Cancer Mother   . Diabetes Father   . Heart disease Father   . Alcohol abuse Brother   . Hypertension Maternal Grandmother   . Hypertension Maternal Grandfather    Social History  Substance Use Topics  . Smoking status: Current Every Day Smoker    Types: Cigarettes  . Smokeless tobacco: Never Used  . Alcohol Use: Yes     Comment: social   OB History    No data available     Review of Systems  HENT: Positive for congestion, dental problem and sinus pressure.   Eyes: Positive for discharge.   Allergies  Review of patient's allergies indicates no known allergies.  Home Medications   Prior to Admission medications   Medication Sig Start Date End Date  Taking? Authorizing Provider  azithromycin (ZITHROMAX) 500 MG tablet Take 2 tablets (1,000 mg total) by mouth daily. 08/27/14   Jacklyn ShellFrances Cresenzo-Dishmon, CNM  clonazePAM (KLONOPIN) 0.5 MG tablet Take 0.5 mg by mouth every 8 (eight) hours as needed for anxiety.    Historical Provider, MD  hydrochlorothiazide (MICROZIDE) 12.5 MG capsule Take 12.5 mg by mouth daily.    Historical Provider, MD  LORazepam (ATIVAN) 1 MG tablet Take 0.5 tablets (0.5 mg total) by mouth 3 (three) times daily as needed for anxiety. Patient not taking: Reported on 08/21/2014 02/29/12   Ivery QualeHobson Bryant, PA-C   BP 163/87 mmHg  Pulse 74  Temp(Src) 98.3 F (36.8 C) (Oral)  Resp 18  Ht 5\' 4"  (1.626 m)  Wt 192 lb (87.091 kg)  BMI 32.94 kg/m2  SpO2 97%  LMP 09/14/2015 Physical Exam  Constitutional: She is oriented to person, place, and time. She appears well-developed and well-nourished. No distress.  HENT:  Head: Normocephalic and atraumatic.  TMs are normal bilaterally. External ear and ear canal normal bilaterally. Oropharynx normal. Right lower first molar with surrounding gum swelling and early abscess. Tender to palpation. Tooth with obvious carries  Eyes: Conjunctivae and EOM are normal.  Neck: Neck supple. No tracheal deviation present.  Cardiovascular: Normal rate.   Pulmonary/Chest: Effort normal. No respiratory distress.  Musculoskeletal: Normal range of motion.  Neurological: She is alert and oriented to person,  place, and time.  Skin: Skin is warm and dry.  Psychiatric: She has a normal mood and affect. Her behavior is normal.  Nursing note and vitals reviewed.   ED Course  Procedures (including critical care time)  DIAGNOSTIC STUDIES: Oxygen Saturation is 97% on RA, normal by my interpretation.    COORDINATION OF CARE: 2:12 PM - Discussed treatment plan with pt at bedside which includes Rx antibiotics to cover for both dental infection and possible bacterial sinusitis. Discussed with pt that sinusitis  may be viral, so she may not see improvement after completing antibiotic course. Advised Zyrtec for sinus symptoms as needed. Pt verbalized understanding and agreed to plan.   MDM   Final diagnoses:  Dental abscess   Patient with toothache.  Abscess noted on exam.  Exam unconcerning for Ludwig's angina or spread of infection.    Urged patient to follow-up with dentist.    Patient complaining of symptoms of sinusitis.  Will cover with amoxicillin for both. Ibuprofen and tramadol for pain.  Filed Vitals:   09/28/15 1252  BP: 163/87  Pulse: 74  Temp: 98.3 F (36.8 C)  TempSrc: Oral  Resp: 18  Height:  (1.626 m)  Weight: 87.091 kg  SpO2: 97%   I personally performed the services described in this documentation, which was scribed in my presence. The recorded information has been reviewed and is accurate.    Jaynie Crumble, PA-C 09/28/15 1451  Blane Ohara, MD 09/28/15 1554

## 2019-02-13 ENCOUNTER — Encounter: Payer: Self-pay | Admitting: Orthopedic Surgery

## 2019-02-13 ENCOUNTER — Ambulatory Visit: Payer: Self-pay | Admitting: Orthopedic Surgery

## 2024-04-02 ENCOUNTER — Ambulatory Visit: Admitting: Orthopedic Surgery

## 2024-04-16 ENCOUNTER — Other Ambulatory Visit (INDEPENDENT_AMBULATORY_CARE_PROVIDER_SITE_OTHER): Payer: Worker's Compensation

## 2024-04-16 ENCOUNTER — Encounter: Payer: Self-pay | Admitting: Orthopedic Surgery

## 2024-04-16 ENCOUNTER — Ambulatory Visit: Payer: Worker's Compensation | Admitting: Orthopedic Surgery

## 2024-04-16 DIAGNOSIS — M1711 Unilateral primary osteoarthritis, right knee: Secondary | ICD-10-CM | POA: Diagnosis not present

## 2024-04-16 DIAGNOSIS — M25561 Pain in right knee: Secondary | ICD-10-CM

## 2024-04-16 DIAGNOSIS — W01198A Fall on same level from slipping, tripping and stumbling with subsequent striking against other object, initial encounter: Secondary | ICD-10-CM | POA: Diagnosis not present

## 2024-04-16 DIAGNOSIS — G8929 Other chronic pain: Secondary | ICD-10-CM | POA: Diagnosis not present

## 2024-04-16 NOTE — Progress Notes (Signed)
 New Patient Visit  Summary: Katherine Bates is a 53 y.o. female with the following: 1. Chronic pain of right knee  Assessment and Plan Assessment & Plan Chronic right knee pain Chronic right knee pain post-trauma with anterior knee discomfort and intermittent swelling. Full range of motion, no swelling or instability. Pain responds to physical therapy. No surgical intervention needed. - Continue physical therapy for remaining sessions. - Home exercise program and ambulation as tolerated for quadriceps and lower extremity strengthening. - Use ice for intermittent swelling as needed. - Ibuprofen  800 mg as needed for analgesia. - Follow-up in six weeks post-physical therapy.  Primary osteoarthritis of right knee Mild primary osteoarthritis on radiography, not primary cause of current pain. Improvement expected with therapy and strengthening. - Physical therapy and strengthening as primary interventions for anterior knee pain and mild osteoarthritis. - Improvement expected with continued therapy. - Surgical intervention not indicated.     Follow-up: Return in about 6 weeks (around 05/28/2024).  Subjective:  Chief Complaint  Patient presents with   Knee Injury    R after a fall at work in [employer] 12/09/23 was sent home and had a virtual visit but wasn't sent for x-rays. Pt states she went back to work for almost 3 wks but ended up having a lot of swelling and pain. Pt has been out of work since 02/26/24 and has been going to PT at [facility] for appox 49mo. Has had x-rays previously but wasn't the same day as injury. Does take ibuprofen , heat and ice for pain and it does help.      Discussed the use of AI scribe software for clinical note transcription with the patient, who gave verbal consent to proceed.  History of Present Illness Katherine Bates is a 53 year old female with mild right knee osteoarthritis who presents for evaluation of persistent right knee pain following a  work-related fall.  On December 09, 2023, she fell at work in a walk-in freezer, landing directly on her right knee after tripping over a box. She needed assistance from coworkers to stand. She returned to work after about three weeks but had persistent right knee pain and swelling with lifting, pulling, pushing, and cleaning, and has been out of work since February 26, 2024 due to these symptoms.  She has anterior right knee pain and swelling around the patella that worsens with prolonged activity and improves with rest. She notes popping with walking and difficulty tying her shoes or getting down on the floor. She denies prior right knee injury.  Initial urgent care x-rays of the knee, leg, hip, arm, and lower back showed no fracture. She is in physical therapy and has completed 5 of 12 sessions with mild improvement in flexibility and symptoms. She uses ibuprofen  800 mg as needed every other day to every three days and applies ice for swelling.    Review of Systems: No fevers or chills No numbness or tingling No chest pain No shortness of breath No bowel or bladder dysfunction No GI distress No headaches   Medical History:  Past Medical History:  Diagnosis Date   Anxiety    Hypertension     Past Surgical History:  Procedure Laterality Date   TUBAL LIGATION      Family History  Problem Relation Age of Onset   Hyperlipidemia Mother    Osteoporosis Mother    Cancer Mother    Diabetes Father    Heart disease Father    Alcohol abuse  Brother    Hypertension Maternal Grandmother    Hypertension Maternal Grandfather    Social History[1]  Allergies[2]  Active Medications[3]  Objective: There were no vitals taken for this visit.  Physical Exam:    General: Alert and oriented. and No acute distress. Gait: Right sided antalgic gait.  Physical Exam MUSCULOSKELETAL: Right knee with good range of motion, without swelling, stable, and without ligament tear.  Negative  Lachman.  No increased laxity varus or valgus stress.  Point tenderness directly over the anterior knee, in line with the inferior aspect of the patella.  No tenderness palpation along the medial or lateral joint line.  No bruising is appreciated   IMAGING: I personally ordered and reviewed the following images   X-rays of the right knee were obtained in clinic today.  No acute injuries are noted.  Mild loss of joint space within the medial compartment.  There are small osteophytes within the patellofemoral compartment.  No bony lesions.  Impression: Right knee x-rays without acute injury; mild degenerative changes overall     New Medications:  No orders of the defined types were placed in this encounter.     Portions of this note were completed via Scientist, clinical (histocompatibility and immunogenetics).  Oneil DELENA Horde, MD  04/16/2024 11:47 AM      [1]  Social History Tobacco Use   Smoking status: Every Day    Types: Cigarettes   Smokeless tobacco: Never  Substance Use Topics   Alcohol use: Yes    Comment: social   Drug use: Yes    Types: Cocaine  [2]  Allergies Allergen Reactions   Tramadol  Hives  [3]  Current Meds  Medication Sig   amoxicillin  (AMOXIL ) 500 MG capsule Take 1 capsule (500 mg total) by mouth 3 (three) times daily.   clonazePAM (KLONOPIN) 0.5 MG tablet Take 0.5 mg by mouth every 8 (eight) hours as needed for anxiety.   hydrochlorothiazide (MICROZIDE) 12.5 MG capsule Take 12.5 mg by mouth daily.   ibuprofen  (ADVIL ,MOTRIN ) 200 MG tablet Take 400 mg by mouth every 6 (six) hours as needed.   ibuprofen  (ADVIL ,MOTRIN ) 600 MG tablet Take 1 tablet (600 mg total) by mouth every 6 (six) hours as needed.   traMADol  (ULTRAM ) 50 MG tablet Take 1 tablet (50 mg total) by mouth every 6 (six) hours as needed.

## 2024-04-29 ENCOUNTER — Telehealth: Payer: Self-pay | Admitting: Orthopedic Surgery

## 2024-04-29 ENCOUNTER — Encounter: Payer: Self-pay | Admitting: Orthopedic Surgery

## 2024-04-29 NOTE — Telephone Encounter (Signed)
 Dr. Onesimo pt - spoke w/the pt, she stated she didn't get an OOW note when she was here on 04/16/24.  Ok to take OOW until next appt 05/28/24?

## 2024-04-29 NOTE — Telephone Encounter (Signed)
 I have typed this up and advised the pt it is ready for her to pickup, she verbalized understanding and stated she'll be by today or tomorrow to get it.

## 2024-05-28 ENCOUNTER — Ambulatory Visit: Payer: Self-pay | Admitting: Orthopedic Surgery

## 2024-06-04 ENCOUNTER — Ambulatory Visit: Admitting: Orthopedic Surgery
